# Patient Record
Sex: Female | Born: 1935 | Race: White | Hispanic: No
Health system: Southern US, Community
[De-identification: ages and names within clinical notes are randomized; demographics above are authoritative.]

---

## 2012-02-04 ENCOUNTER — Observation Stay: Payer: Self-pay | Admitting: Internal Medicine

## 2012-02-04 LAB — CBC WITH DIFFERENTIAL/PLATELET
Basophil #: 0.1 10*3/uL (ref 0.0–0.1)
Basophil %: 0.9 %
Eosinophil %: 3.5 %
HCT: 41 % (ref 35.0–47.0)
HGB: 12.9 g/dL (ref 12.0–16.0)
Lymphocyte #: 1.8 10*3/uL (ref 1.0–3.6)
Lymphocyte %: 16.7 %
MCH: 27.4 pg (ref 26.0–34.0)
MCHC: 31.4 g/dL — ABNORMAL LOW (ref 32.0–36.0)
MCV: 87 fL (ref 80–100)
Monocyte #: 1.4 x10 3/mm — ABNORMAL HIGH (ref 0.2–0.9)
Monocyte %: 12.8 %
Neutrophil #: 7.3 10*3/uL — ABNORMAL HIGH (ref 1.4–6.5)

## 2012-02-04 LAB — URINALYSIS, COMPLETE
Bacteria: NONE SEEN
Bilirubin,UR: NEGATIVE
Glucose,UR: NEGATIVE mg/dL (ref 0–75)
Ketone: NEGATIVE
RBC,UR: 1 /HPF (ref 0–5)
Specific Gravity: 1.011 (ref 1.003–1.030)
Squamous Epithelial: NONE SEEN
WBC UR: <1 /HPF (ref 0–5)

## 2012-02-04 LAB — COMPREHENSIVE METABOLIC PANEL
Albumin: 3.1 g/dL — ABNORMAL LOW (ref 3.4–5.0)
Alkaline Phosphatase: 128 U/L (ref 50–136)
Bilirubin,Total: 0.3 mg/dL (ref 0.2–1.0)
Calcium, Total: 9.7 mg/dL (ref 8.5–10.1)
Chloride: 102 mmol/L (ref 98–107)
Co2: 32 mmol/L (ref 21–32)
Creatinine: 0.74 mg/dL (ref 0.60–1.30)
Potassium: 4.4 mmol/L (ref 3.5–5.1)
SGOT(AST): 20 U/L (ref 15–37)
SGPT (ALT): 24 U/L (ref 12–78)
Total Protein: 8.6 g/dL — ABNORMAL HIGH (ref 6.4–8.2)

## 2012-02-04 LAB — TROPONIN I: Troponin-I: <0.02 ng/mL

## 2012-02-06 LAB — CBC WITH DIFFERENTIAL/PLATELET
Basophil %: 0.8 %
HGB: 10.7 g/dL — ABNORMAL LOW (ref 12.0–16.0)
Lymphocyte #: 1.6 10*3/uL (ref 1.0–3.6)
MCH: 28.8 pg (ref 26.0–34.0)
MCHC: 33.2 g/dL (ref 32.0–36.0)
MCV: 87 fL (ref 80–100)
Monocyte #: 1.3 x10 3/mm — ABNORMAL HIGH (ref 0.2–0.9)
Monocyte %: 14.1 %
Neutrophil #: 6.1 10*3/uL (ref 1.4–6.5)
Neutrophil %: 65 %
Platelet: 416 10*3/uL (ref 150–440)
WBC: 9.4 10*3/uL (ref 3.6–11.0)

## 2012-02-06 LAB — BASIC METABOLIC PANEL
Chloride: 107 mmol/L (ref 98–107)
Co2: 30 mmol/L (ref 21–32)
Creatinine: 0.76 mg/dL (ref 0.60–1.30)
EGFR (Non-African Amer.): >60
Glucose: 103 mg/dL — ABNORMAL HIGH (ref 65–99)
Osmolality: 285 (ref 275–301)
Sodium: 144 mmol/L (ref 136–145)

## 2012-02-09 LAB — CULTURE, BLOOD (SINGLE)

## 2012-02-11 ENCOUNTER — Emergency Department: Payer: Self-pay | Admitting: Unknown Physician Specialty

## 2012-02-11 LAB — URINALYSIS, COMPLETE
Hyaline Cast: 6
Nitrite: NEGATIVE
Ph: 5 (ref 4.5–8.0)
RBC,UR: 4 /HPF (ref 0–5)
WBC UR: 6 /HPF (ref 0–5)

## 2012-02-11 LAB — CK TOTAL AND CKMB (NOT AT ARMC)
CK, Total: 95 U/L (ref 21–215)
CK-MB: 2.9 ng/mL (ref 0.5–3.6)

## 2012-02-11 LAB — CBC
HGB: 12.6 g/dL (ref 12.0–16.0)
MCH: 27.8 pg (ref 26.0–34.0)
MCHC: 32.5 g/dL (ref 32.0–36.0)
MCV: 86 fL (ref 80–100)
Platelet: 596 10*3/uL — ABNORMAL HIGH (ref 150–440)
RDW: 15.3 % — ABNORMAL HIGH (ref 11.5–14.5)

## 2012-02-11 LAB — COMPREHENSIVE METABOLIC PANEL
BUN: 16 mg/dL (ref 7–18)
Calcium, Total: 9.2 mg/dL (ref 8.5–10.1)
Chloride: 99 mmol/L (ref 98–107)
Co2: 34 mmol/L — ABNORMAL HIGH (ref 21–32)
EGFR (African American): >60
EGFR (Non-African Amer.): >60
Glucose: 131 mg/dL — ABNORMAL HIGH (ref 65–99)
SGOT(AST): 18 U/L (ref 15–37)
SGPT (ALT): 16 U/L (ref 12–78)

## 2012-02-11 LAB — TROPONIN I: Troponin-I: <0.02 ng/mL

## 2012-02-28 ENCOUNTER — Inpatient Hospital Stay: Payer: Self-pay | Admitting: Internal Medicine

## 2012-02-28 LAB — COMPREHENSIVE METABOLIC PANEL
Albumin: 3.5 g/dL (ref 3.4–5.0)
Bilirubin,Total: 0.5 mg/dL (ref 0.2–1.0)
Chloride: 102 mmol/L (ref 98–107)
Co2: 33 mmol/L — ABNORMAL HIGH (ref 21–32)
EGFR (Non-African Amer.): >60
Glucose: 129 mg/dL — ABNORMAL HIGH (ref 65–99)
SGOT(AST): 19 U/L (ref 15–37)
SGPT (ALT): 18 U/L (ref 12–78)
Sodium: 144 mmol/L (ref 136–145)
Total Protein: 7.3 g/dL (ref 6.4–8.2)

## 2012-02-28 LAB — URINALYSIS, COMPLETE
Bilirubin,UR: NEGATIVE
Glucose,UR: NEGATIVE mg/dL (ref 0–75)
Leukocyte Esterase: NEGATIVE
Nitrite: NEGATIVE
Ph: 5 (ref 4.5–8.0)
Protein: 30
RBC,UR: 2 /HPF (ref 0–5)
Specific Gravity: 1.026 (ref 1.003–1.030)
Squamous Epithelial: NONE SEEN

## 2012-02-28 LAB — CBC
HGB: 12.9 g/dL (ref 12.0–16.0)
MCH: 28.3 pg (ref 26.0–34.0)
MCHC: 32.2 g/dL (ref 32.0–36.0)
MCV: 88 fL (ref 80–100)
Platelet: 272 10*3/uL (ref 150–440)

## 2012-02-28 LAB — PRO B NATRIURETIC PEPTIDE: B-Type Natriuretic Peptide: 251 pg/mL (ref 0–450)

## 2012-02-28 LAB — CK TOTAL AND CKMB (NOT AT ARMC)
CK-MB: 6 ng/mL — ABNORMAL HIGH (ref 0.5–3.6)
CK-MB: 7 ng/mL — ABNORMAL HIGH (ref 0.5–3.6)

## 2012-02-28 LAB — TROPONIN I: Troponin-I: <0.02 ng/mL

## 2012-02-29 LAB — BASIC METABOLIC PANEL
Anion Gap: 7 (ref 7–16)
Co2: 31 mmol/L (ref 21–32)
Creatinine: 0.57 mg/dL — ABNORMAL LOW (ref 0.60–1.30)
EGFR (African American): >60
Glucose: 142 mg/dL — ABNORMAL HIGH (ref 65–99)
Sodium: 146 mmol/L — ABNORMAL HIGH (ref 136–145)

## 2012-02-29 LAB — CBC WITH DIFFERENTIAL/PLATELET
Basophil #: 0 10*3/uL (ref 0.0–0.1)
Basophil %: 0.5 %
Eosinophil #: 0 10*3/uL (ref 0.0–0.7)
HCT: 34.2 % — ABNORMAL LOW (ref 35.0–47.0)
HGB: 11.6 g/dL — ABNORMAL LOW (ref 12.0–16.0)
Lymphocyte %: 11.3 %
MCHC: 33.8 g/dL (ref 32.0–36.0)
Monocyte %: 1.7 %
Neutrophil #: 4.5 10*3/uL (ref 1.4–6.5)
Neutrophil %: 86.4 %
Platelet: 182 10*3/uL (ref 150–440)
RBC: 3.88 10*6/uL (ref 3.80–5.20)
RDW: 16 % — ABNORMAL HIGH (ref 11.5–14.5)
WBC: 5.2 10*3/uL (ref 3.6–11.0)

## 2012-02-29 LAB — URINE CULTURE

## 2012-02-29 LAB — CK TOTAL AND CKMB (NOT AT ARMC): CK-MB: 3.6 ng/mL (ref 0.5–3.6)

## 2012-03-02 LAB — CBC WITH DIFFERENTIAL/PLATELET
Basophil #: 0.1 10*3/uL (ref 0.0–0.1)
Basophil %: 0.5 %
Eosinophil #: 0 10*3/uL (ref 0.0–0.7)
Eosinophil %: 0 %
HCT: 36.3 % (ref 35.0–47.0)
Lymphocyte #: 0.5 10*3/uL — ABNORMAL LOW (ref 1.0–3.6)
MCH: 28 pg (ref 26.0–34.0)
MCHC: 32 g/dL (ref 32.0–36.0)
MCV: 88 fL (ref 80–100)
Monocyte #: 0.4 x10 3/mm (ref 0.2–0.9)
Neutrophil #: 10.9 10*3/uL — ABNORMAL HIGH (ref 1.4–6.5)
Platelet: 195 10*3/uL (ref 150–440)
RDW: 15.9 % — ABNORMAL HIGH (ref 11.5–14.5)
WBC: 11.9 10*3/uL — ABNORMAL HIGH (ref 3.6–11.0)

## 2012-03-02 LAB — BASIC METABOLIC PANEL
Anion Gap: 6 — ABNORMAL LOW (ref 7–16)
Calcium, Total: 8.9 mg/dL (ref 8.5–10.1)
Creatinine: 0.68 mg/dL (ref 0.60–1.30)
EGFR (African American): >60
EGFR (Non-African Amer.): >60
Glucose: 165 mg/dL — ABNORMAL HIGH (ref 65–99)
Sodium: 140 mmol/L (ref 136–145)

## 2012-03-05 LAB — CULTURE, BLOOD (SINGLE)

## 2012-04-12 ENCOUNTER — Ambulatory Visit: Payer: Self-pay | Admitting: Internal Medicine

## 2012-04-12 ENCOUNTER — Inpatient Hospital Stay: Payer: Self-pay | Admitting: Specialist

## 2012-04-12 LAB — CBC WITH DIFFERENTIAL/PLATELET
Eosinophil #: 0.1 10*3/uL (ref 0.0–0.7)
Eosinophil %: 0.4 %
HCT: 40.3 % (ref 35.0–47.0)
Lymphocyte #: 3.9 10*3/uL — ABNORMAL HIGH (ref 1.0–3.6)
Lymphocyte %: 21.2 %
MCH: 29.2 pg (ref 26.0–34.0)
MCV: 91 fL (ref 80–100)
Monocyte #: 1.5 x10 3/mm — ABNORMAL HIGH (ref 0.2–0.9)
Platelet: 375 10*3/uL (ref 150–440)
RBC: 4.45 10*6/uL (ref 3.80–5.20)
WBC: 18.6 10*3/uL — ABNORMAL HIGH (ref 3.6–11.0)

## 2012-04-12 LAB — COMPREHENSIVE METABOLIC PANEL
Albumin: 3.7 g/dL (ref 3.4–5.0)
Alkaline Phosphatase: 82 U/L (ref 50–136)
Bilirubin,Total: 0.2 mg/dL (ref 0.2–1.0)
Chloride: 108 mmol/L — ABNORMAL HIGH (ref 98–107)
Co2: 28 mmol/L (ref 21–32)
Creatinine: 0.81 mg/dL (ref 0.60–1.30)
EGFR (African American): >60
Osmolality: 295 (ref 275–301)
Potassium: 3.8 mmol/L (ref 3.5–5.1)
Sodium: 147 mmol/L — ABNORMAL HIGH (ref 136–145)
Total Protein: 7.2 g/dL (ref 6.4–8.2)

## 2012-04-12 LAB — URINALYSIS, COMPLETE
Bilirubin,UR: NEGATIVE
Blood: NEGATIVE
Hyaline Cast: 3
Ketone: NEGATIVE
Specific Gravity: 1.019 (ref 1.003–1.030)
Squamous Epithelial: 4
WBC UR: 18 /HPF (ref 0–5)

## 2012-04-12 LAB — PROTIME-INR: Prothrombin Time: 12.7 secs (ref 11.5–14.7)

## 2012-04-12 LAB — TROPONIN I: Troponin-I: 0.1 ng/mL — ABNORMAL HIGH

## 2012-04-12 LAB — CK TOTAL AND CKMB (NOT AT ARMC): CK, Total: 109 U/L (ref 21–215)

## 2012-04-13 LAB — BASIC METABOLIC PANEL
Anion Gap: 7 (ref 7–16)
Calcium, Total: 9.2 mg/dL (ref 8.5–10.1)
Chloride: 108 mmol/L — ABNORMAL HIGH (ref 98–107)
Co2: 31 mmol/L (ref 21–32)
EGFR (African American): >60
Osmolality: 294 (ref 275–301)
Potassium: 4.9 mmol/L (ref 3.5–5.1)

## 2012-04-13 LAB — CBC WITH DIFFERENTIAL/PLATELET
Basophil #: 0 10*3/uL (ref 0.0–0.1)
Eosinophil #: 0 10*3/uL (ref 0.0–0.7)
Eosinophil %: 0 %
Lymphocyte #: 1 10*3/uL (ref 1.0–3.6)
MCH: 29.5 pg (ref 26.0–34.0)
MCHC: 32.6 g/dL (ref 32.0–36.0)
MCV: 91 fL (ref 80–100)
Monocyte #: 0.5 x10 3/mm (ref 0.2–0.9)
Neutrophil #: 13.3 10*3/uL — ABNORMAL HIGH (ref 1.4–6.5)
Platelet: 287 10*3/uL (ref 150–440)
RBC: 3.91 10*6/uL (ref 3.80–5.20)
RDW: 16.4 % — ABNORMAL HIGH (ref 11.5–14.5)

## 2012-04-13 LAB — LIPID PANEL
HDL Cholesterol: 79 mg/dL — ABNORMAL HIGH (ref 40–60)
Triglycerides: 77 mg/dL (ref 0–200)
VLDL Cholesterol, Calc: 15 mg/dL (ref 5–40)

## 2012-04-13 LAB — URINE CULTURE

## 2012-04-15 ENCOUNTER — Ambulatory Visit: Payer: Self-pay | Admitting: Internal Medicine

## 2012-04-15 LAB — CULTURE, BLOOD (SINGLE)

## 2012-04-17 LAB — CULTURE, BLOOD (SINGLE)

## 2012-10-13 DEATH — deceased

## 2013-09-28 IMAGING — CT CT HEAD WITHOUT CONTRAST
2 series · 15 of 30 positions shown, 19 images · non-contrast
Comparison: none

REASON FOR EXAM: ams
COMMENTS:   May transport without cardiac monitor

PROCEDURE:     CT  - CT HEAD WITHOUT CONTRAST  - February 04, 2012  [DATE]
RESULT:     Comparison:  None
TECHNIQUE: Multiple axial images from the foramen magnum to the vertex were
obtained without IV contrast.

[Series 2: without · axial · non-contrast · 0.43mm/px · z∈[-152,-32]mm · 13 of 29 slices shown, 17 images]
[im 3/29  brain]
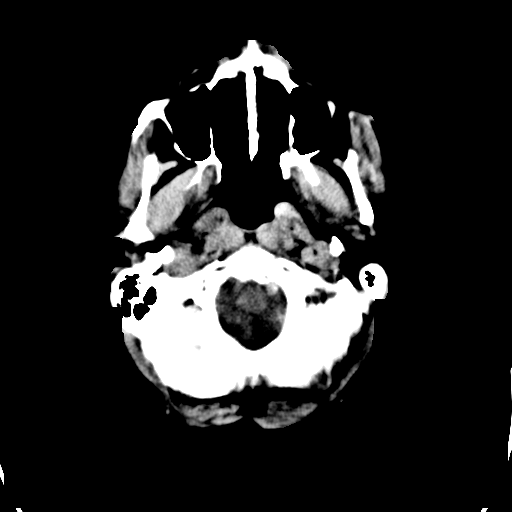
[im 3/29  bone]
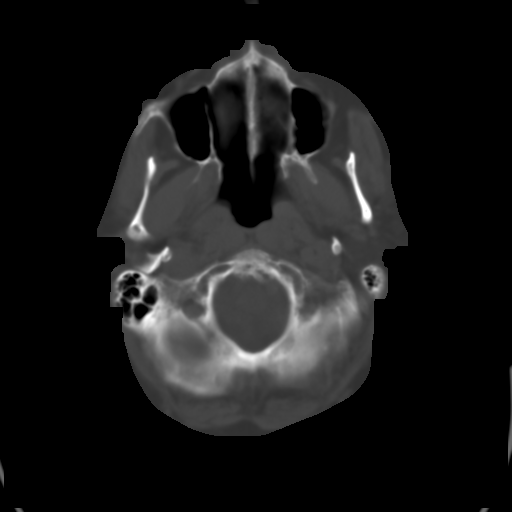
[im 5/29  brain]
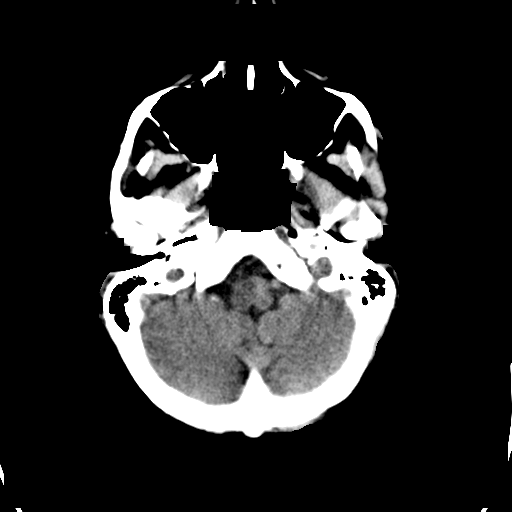
[im 7/29  brain]
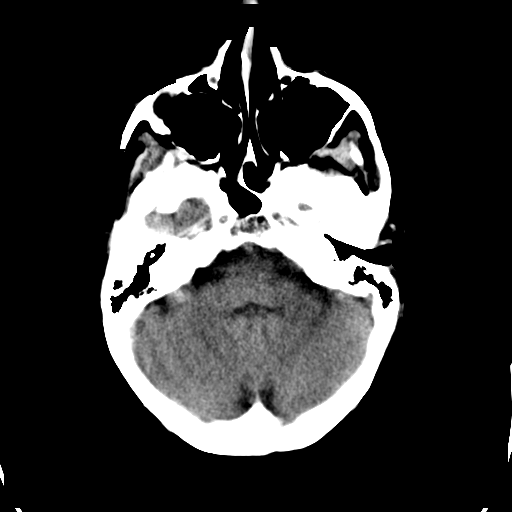
[im 9/29  brain]
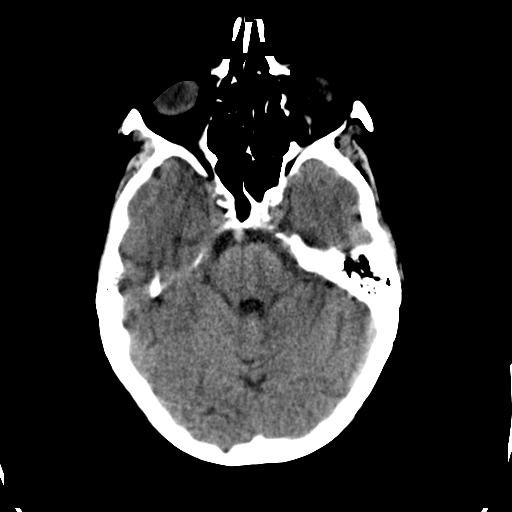
[im 11/29  brain]
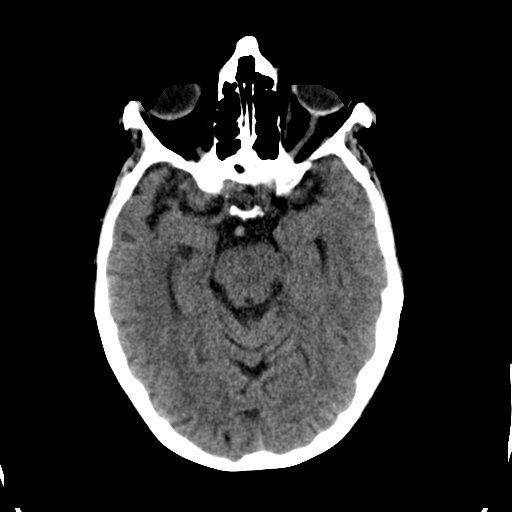
[im 11/29  bone]
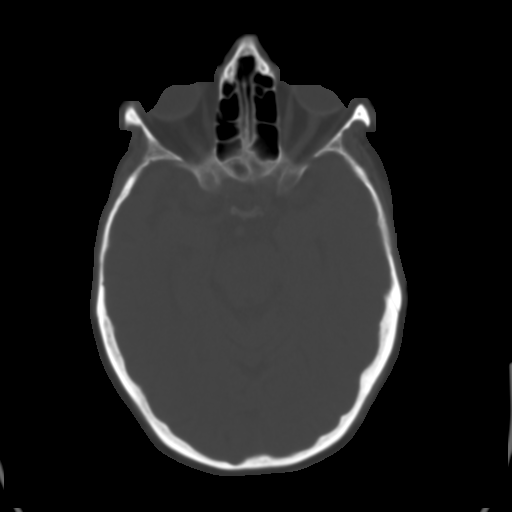
[im 13/29  brain]
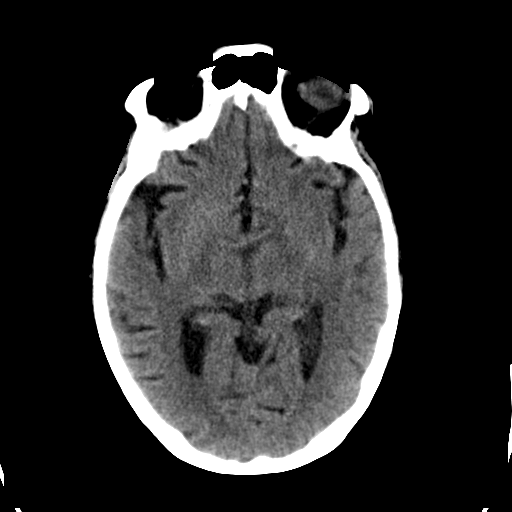
[im 15/29  brain]
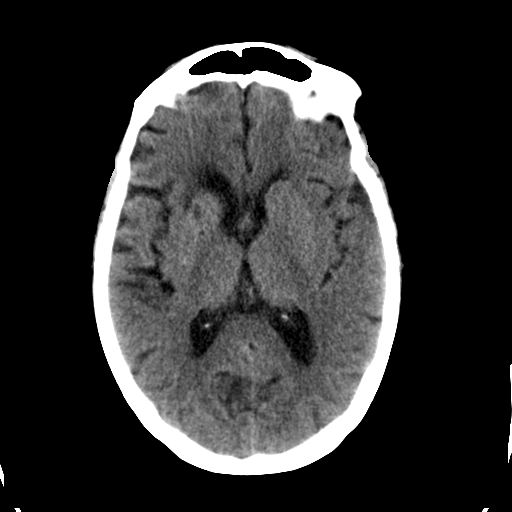
[im 17/29  brain]
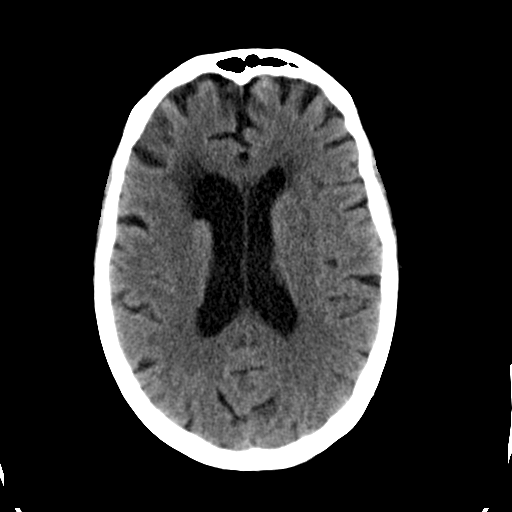
[im 19/29  brain]
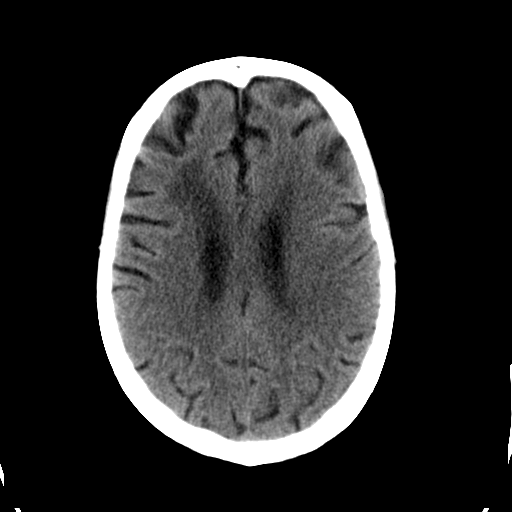
[im 19/29  bone]
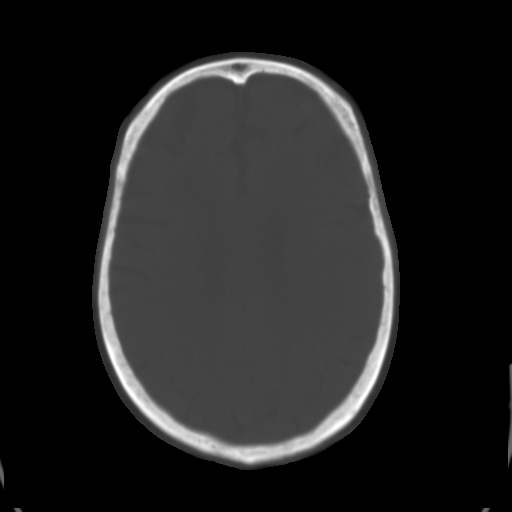
[im 21/29  brain]
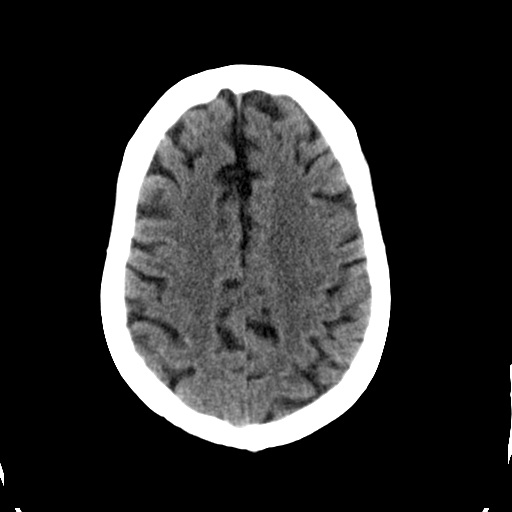
[im 23/29  brain]
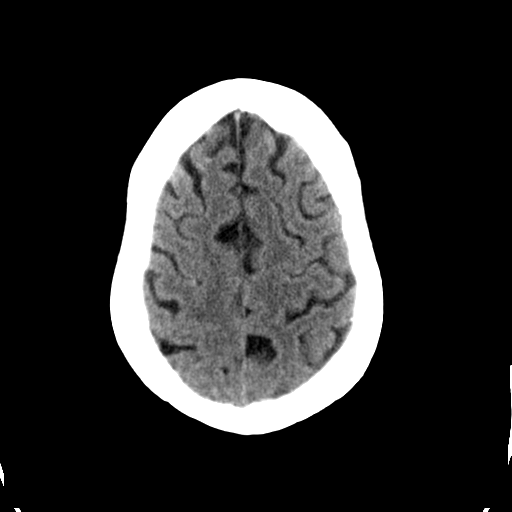
[im 25/29  brain]
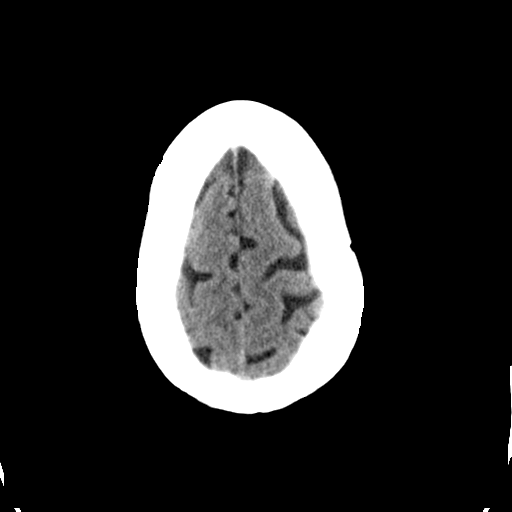
[im 27/29  brain]
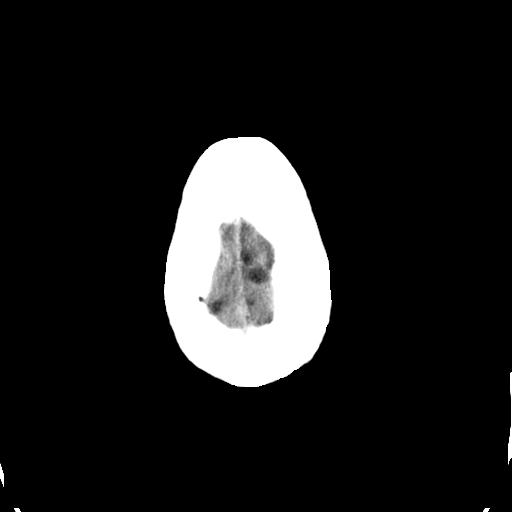
[im 27/29  bone]
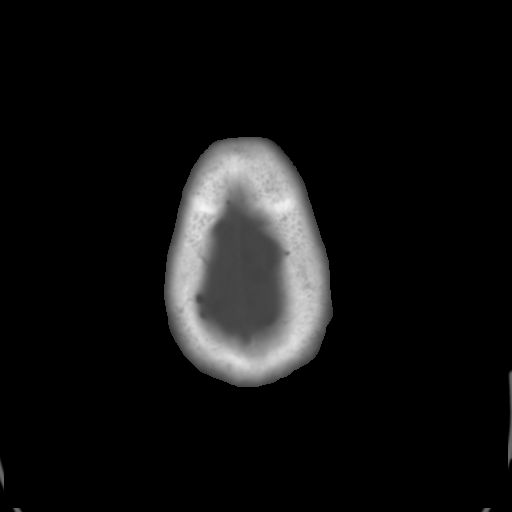

[Series 3: bone · axial · 0.43mm/px · z∈[-152,-132]mm · 2 of 29 slices shown]
[im 3/29  bone]
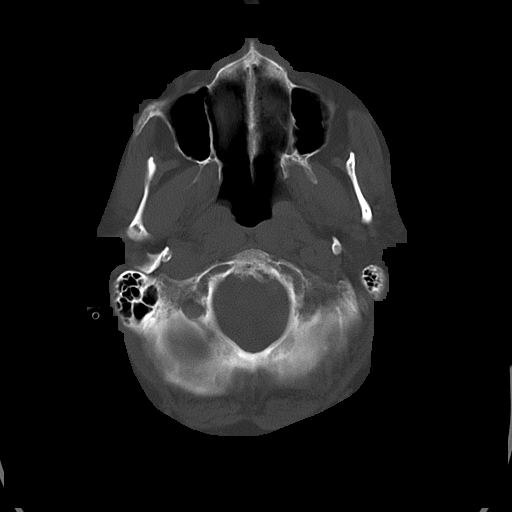
[im 7/29  bone]
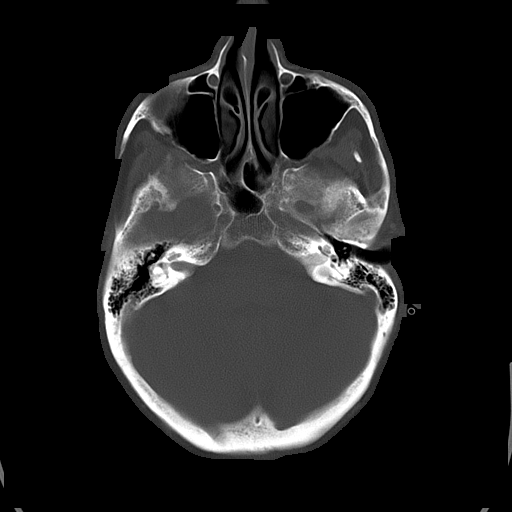

[15 of 30 positions shown; findings below may reference images not displayed]

FINDINGS: There is no evidence of mass effect, midline shift, or extra-axial fluid
collections.  There is no evidence of a space-occupying lesion or
intracranial hemorrhage. There is no evidence of a cortical-based area of
acute infarction. There is an old right basal ganglia lacunar infarct. There
is generalized cerebral atrophy. There is periventricular white matter low
attenuation likely secondary to microangiopathy.

The ventricles and sulci are appropriate for the patient's age. The basal
cisterns are patent.

Visualized portions of the orbits are unremarkable. The visualized portions
of the paranasal sinuses and mastoid air cells are unremarkable.

The osseous structures are unremarkable.
IMPRESSION: No acute intracranial process.

CT can underestimate ischemia in the first 24 hours after the event. If
there is clinical concern for an acute infarct, a followup MRI or repeat CT
scan in 24 hours may provide additional information.

[REDACTED]

## 2013-10-05 IMAGING — CT CT CHEST W/ CM
1 series · 15 of 31 positions shown, 19 images · IV contrast (agent unspecified)
Comparison: none

REASON FOR EXAM: sob weakness pe protocol
COMMENTS:   May transport without cardiac monitor

PROCEDURE:     CT  - CT CHEST WITH CONTRAST  - February 11, 2012  [DATE]
RESULT:
TECHNIQUE: Helical 3 mm sections were obtained from the thoracic inlet
through the lung bases status post intravenous administration of 100 mm of
[id number].

[Series 4: soft tissue · axial · 0.62mm/px · z∈[-750,-492]mm · 15 of 94 slices shown, 19 images]
[im 4/94  mediastinal]
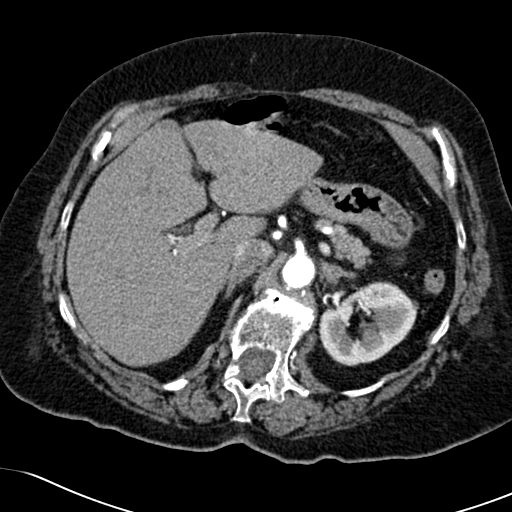
[im 4/94  lung]
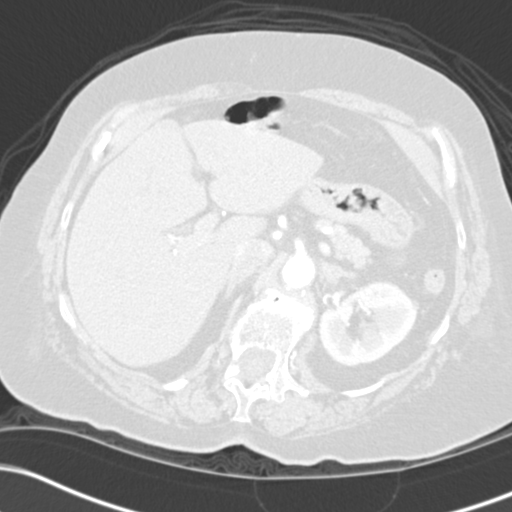
[im 11/94  lung]
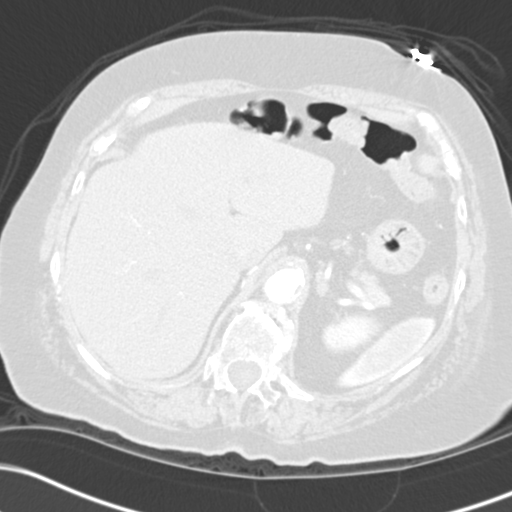
[im 18/94  lung]
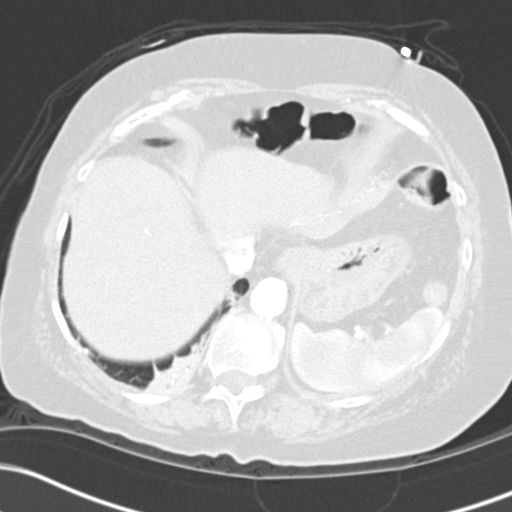
[im 21/94  lung]
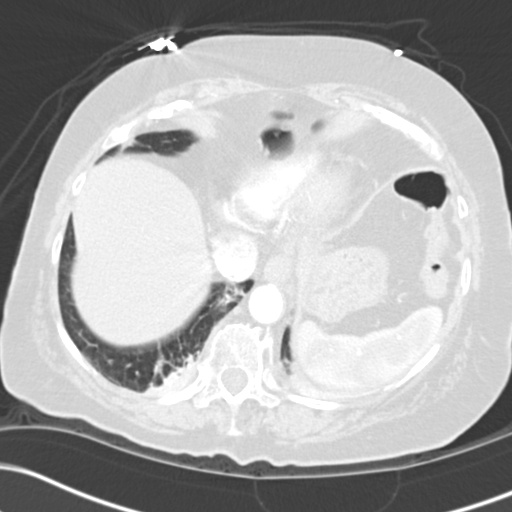
[im 28/94  mediastinal]
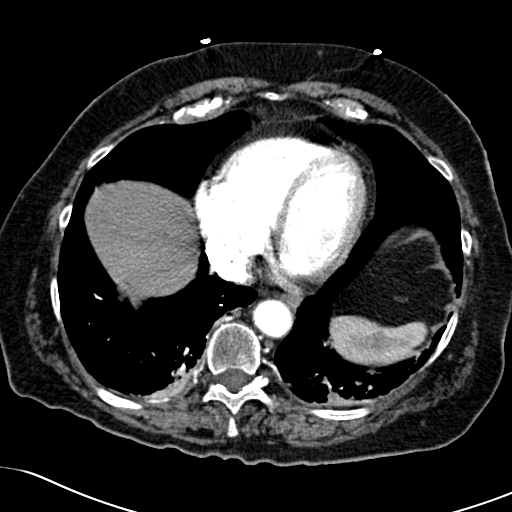
[im 28/94  lung]
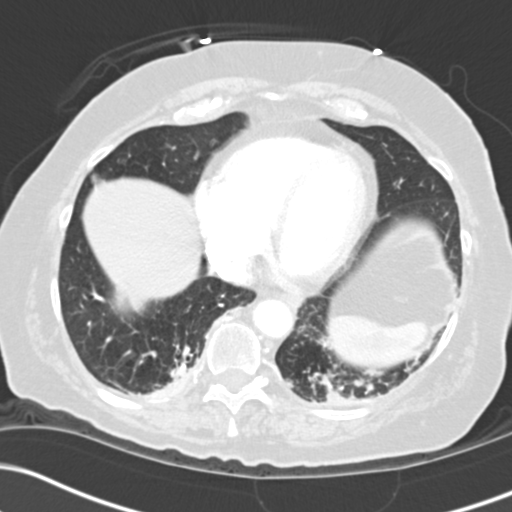
[im 35/94  lung]
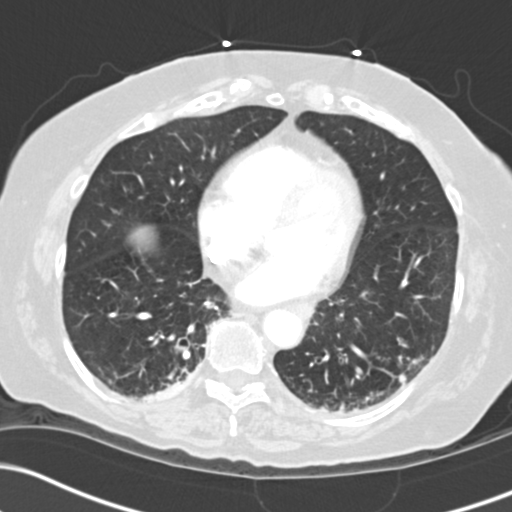
[im 42/94  lung]
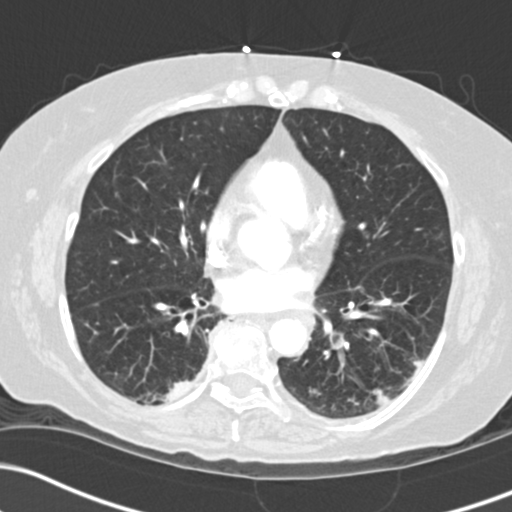
[im 49/94  lung]
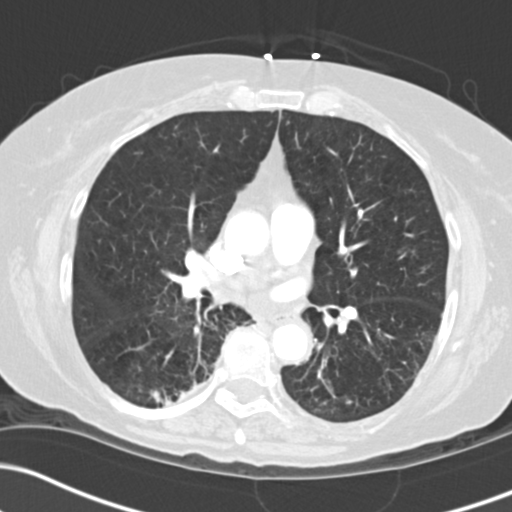
[im 52/94  mediastinal]
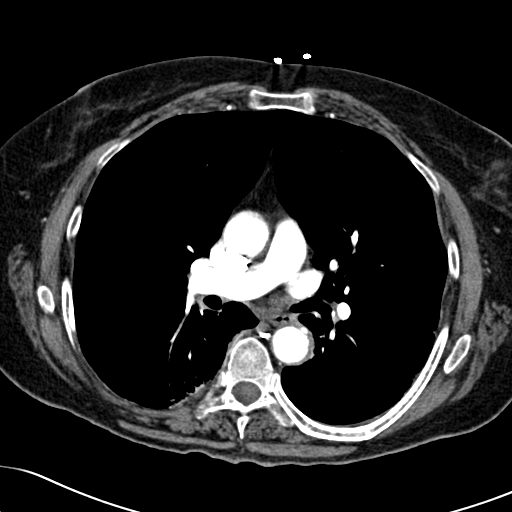
[im 52/94  lung]
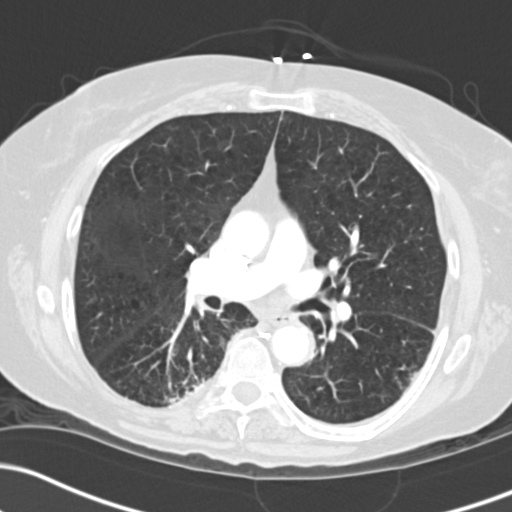
[im 59/94  lung]
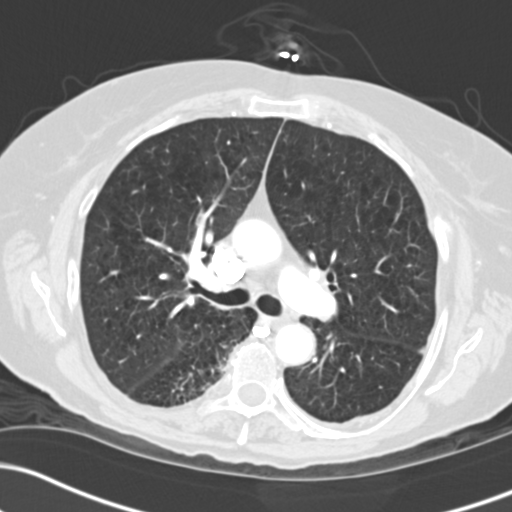
[im 66/94  lung]
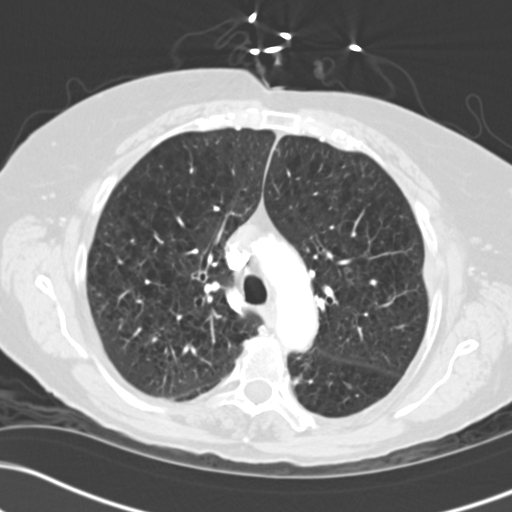
[im 73/94  lung]
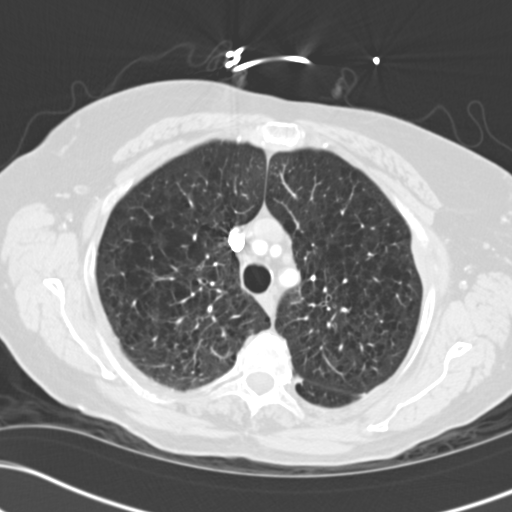
[im 76/94  mediastinal]
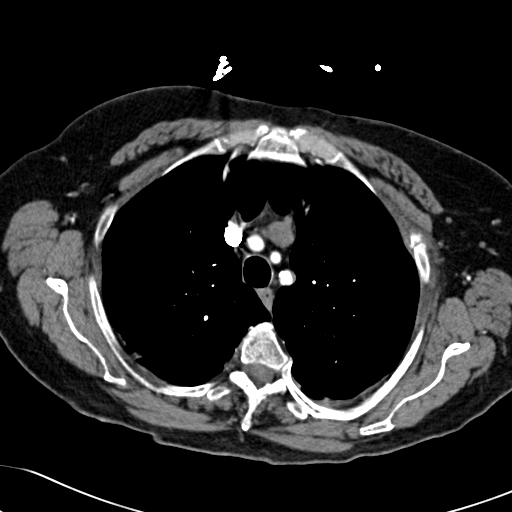
[im 76/94  lung]
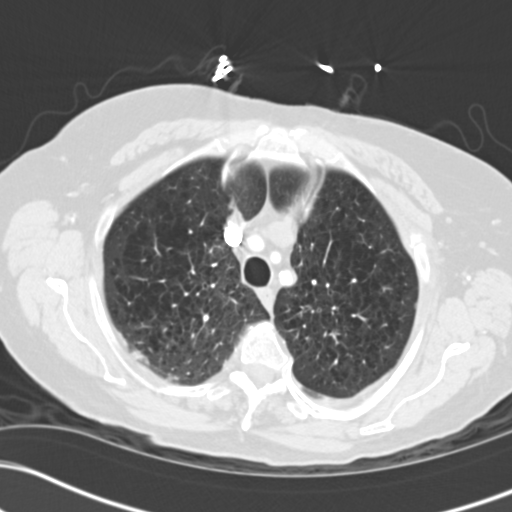
[im 83/94  lung]
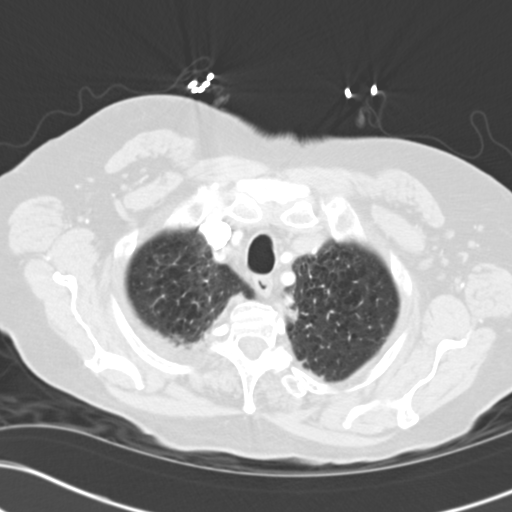
[im 90/94  lung]
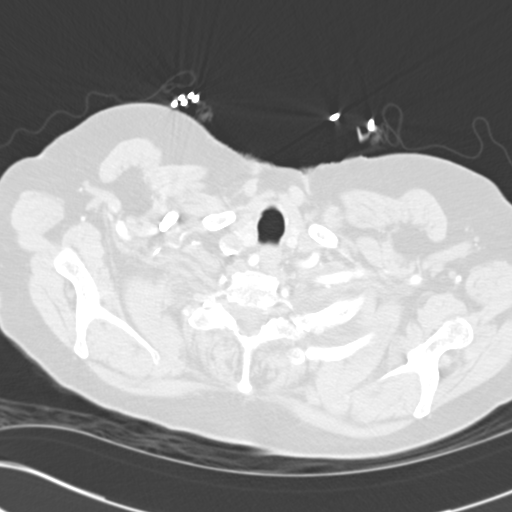

[15 of 31 positions shown; findings below may reference images not displayed]

FINDINGS: The mediastinum, hilar regions and structures demonstrate no
evidence of mediastinal or hilar adenopathy or masses.

There is no evidence of filling defects within the main, lobar or segmental
pulmonary arteries. Diffuse emphysematous changes are appreciated throughout
both lungs with interstitial changes the within the lung bases. Patchy
ill-defined multifocal areas of increased density project within the
dependent portions of the lungs. A more consolidative component is
identified on the right.

The visualized upper abdominal viscera demonstrate no gross abnormalities.
IMPRESSION: 1. No CT evidence of pulmonary arterial embolic disease.
2. Emphysematous changes moderate to severe bilaterally
3. Atelectasis versus infiltrate within lung bases as well as likely a
component of pulmonary fibrosis.
4. Not mentioned above, a healing rib fracture is identified.
5. Dr. Tiger of the Emergency Department was informed of these findings via
preliminary faxed report.

## 2013-10-22 IMAGING — CR DG CHEST 1V PORT
1 series · 1 of 1 positions shown · non-contrast
Comparison: none

REASON FOR EXAM: Shortness of Breath
COMMENTS:

PROCEDURE:     DXR - DXR PORTABLE CHEST SINGLE VIEW  - February 28, 2012 [DATE]
RESULT:     Comparison: 02/11/2012

[ap]
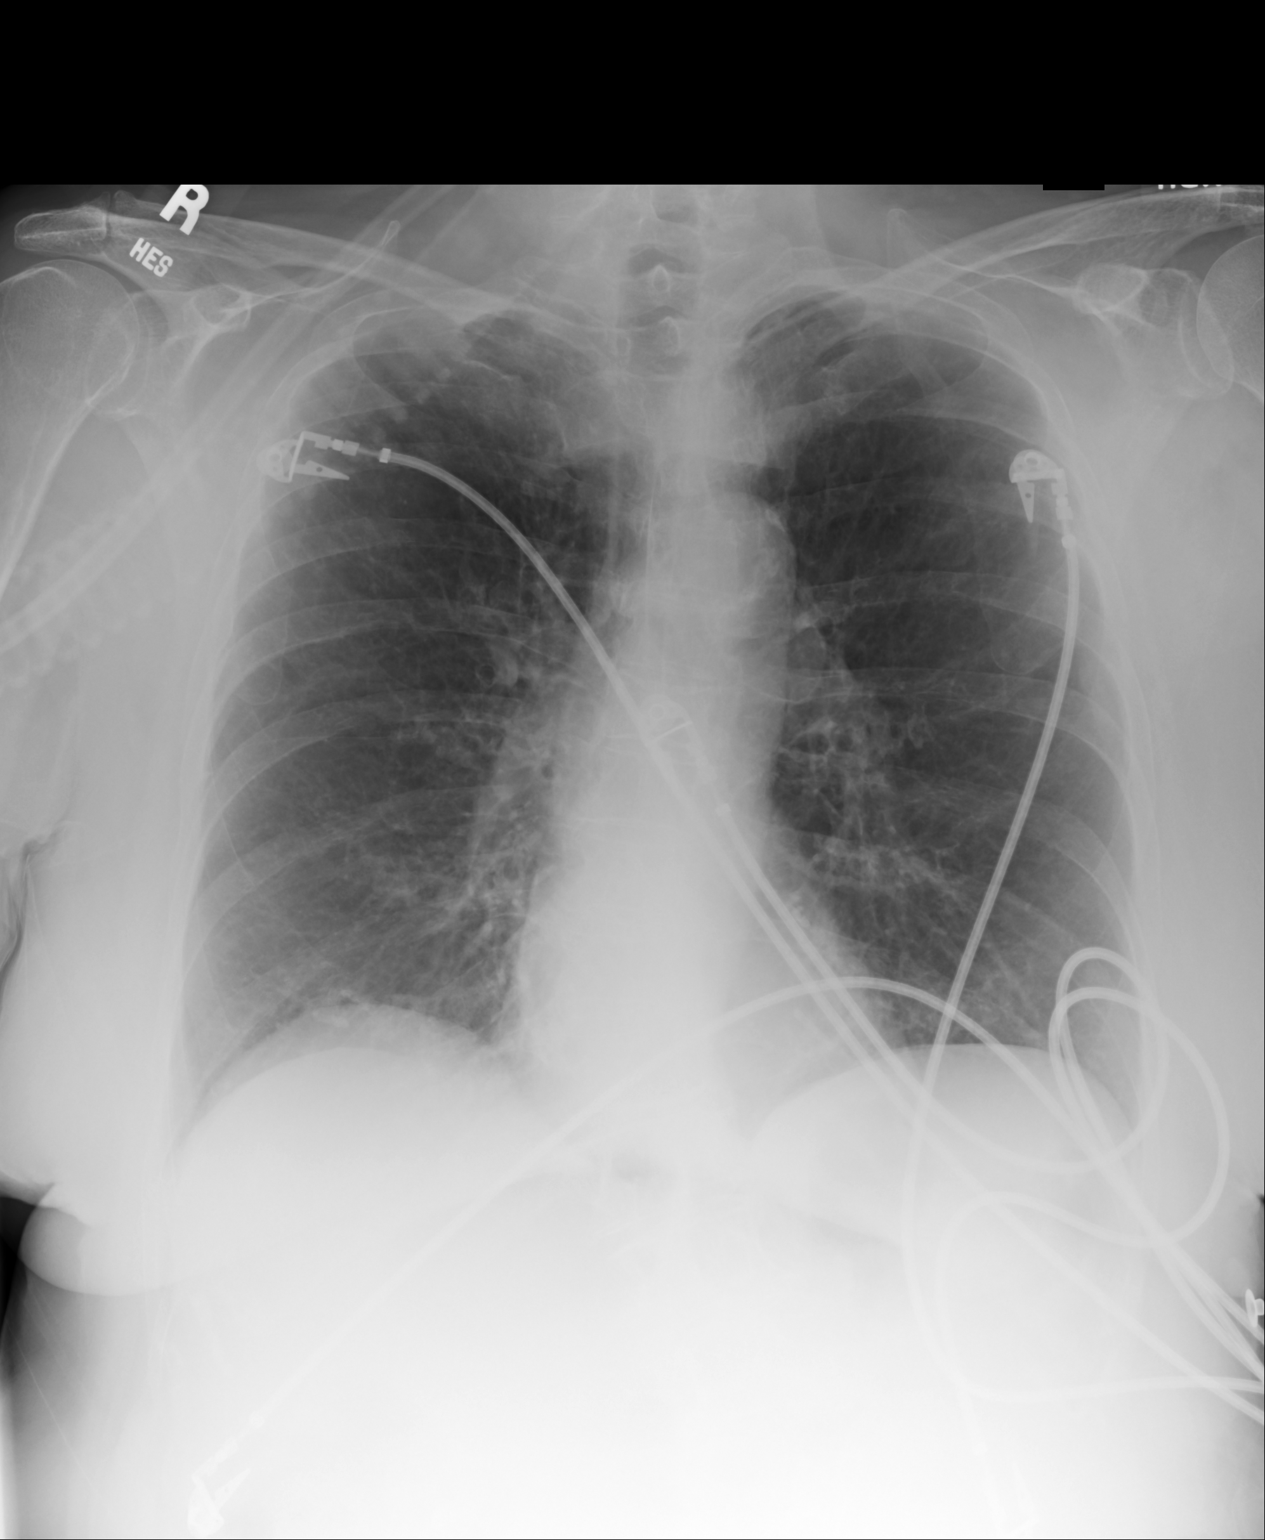

[1 of 1 positions shown; findings below may reference images not displayed]

FINDINGS: The heart and mediastinum are stable. Mild prominence of the superior
mediastinum is similar to prior. The lungs are hyperinflated. Mild
interstitial opacities are similar to prior, likely secondary to chronic
interstitial lung disease.
IMPRESSION: Hyperinflation. Otherwise, no acute cardiopulmonary disease.

[REDACTED]

## 2014-10-02 NOTE — H&P (Signed)
PATIENT NAME:  Danielle Moran, Danielle Moran MR#:  [id number] DATE OF BIRTH:  September 21, 1930  DATE OF ADMISSION:  02/28/2012  PRIMARY CARE PHYSICIAN: Dr. Sharman Crate Hamrick   History obtained from patient, her daughter and son-in-law at bedside. Old records have been reviewed, EKG and chest x-ray personally reviewed.   CHIEF COMPLAINT: Shortness of breath.   HISTORY OF PRESENT ILLNESS: 81 year old Caucasian female patient with history of chronic obstructive pulmonary disease, chronic respiratory failure on 3 liters oxygen at home presents to the Emergency Room brought in by EMS after patient had progressively worsening shortness of breath over the last three days. Patient was previously in the hospital from 08/22 to 02/07/2012 and discharged, returned to Emergency Room with similar problems on 08/29, was started on prednisone and sent home from ER. Patient felt mildly better but started worsening and patient returned to the Emergency Room today. She has had a dry cough. No chest pain. No nausea, vomiting, abdominal pain, diarrhea, dysuria. She does feel weak with progressively shortness of breath. Patient smoked in the past, does not smoke anymore. Lives at [facility] with her daughter and son-in-law. She finished her prednisone four days back.   PAST MEDICAL HISTORY:  1. Chronic obstructive pulmonary disease.  2. Chronic respiratory failure on 2 to 3 liters oxygen at home.  3. Hypertension.  4. Hyperlipidemia.  5. Depression.  6. Gastroesophageal reflux disease.   REVIEW OF SYSTEMS: CONSTITUTIONAL: Complains of fatigue, weakness. No weight loss or weight gain. EYES: No blurred vision, pain, redness. ENT: No tinnitus, ear pain, hearing loss. RESPIRATORY: Has cough which is dry. No hemoptysis. Shortness of breath positive. CARDIOVASCULAR: No chest pain, orthopnea, edema. GASTROINTESTINAL: No nausea, vomiting, diarrhea, abdominal pain. GENITOURINARY: No dysuria, hematuria, frequency. GENITOURINARY: No sores, discharge.  ENDOCRINE: No polyuria, nocturia, thyroid problems. HEMATOLOGIC/LYMPHATIC: No anemia, easy bruising, bleeding. INTEGUMENTARY: No acne, skin rash. MUSCULOSKELETAL: Has arthritic pain. No swelling or gout. NEUROLOGIC: No numbness, weakness, dysarthria. PSYCHIATRIC: No anxiety but has depression.   ALLERGIES: No known drug allergies.   SOCIAL HISTORY: Patient was a long-time smoker with 40 pack smoking history but quit many years back. Occasional alcohol use. No illicit drug use. Lives with her daughter and son-in-law.   FAMILY HISTORY: No significant family history of coronary artery disease or diabetes.   HOME MEDICATIONS:  1. Advair 250/50 1 puff b.i.d.  2. Celexa 20 mg daily.  3. Lasix 20 mg daily.  4. Calcium carbonate 1 tablet daily.  5. Gabapentin 400 mg 3 times a day.  6. Metoprolol succinate 12.5 mg daily.  7. Omeprazole 20 mg twice a day.  8. Percocet 5/325, 1 tablet every six hours as needed.  9. Pravachol 20 mg at bedtime.  10. Albuterol inhaler 2 puffs q.6 hours as needed.  11. Spiriva 1 puff daily.  12. Remeron 15 mg at bedtime.  13. Tylenol arthritis 650 mg as needed every six hours.   PHYSICAL EXAMINATION:  VITAL SIGNS: Temperature 99, pulse 136, respirations 40, blood pressure 130/69 and saturating 96% on BiPAP.   GENERAL: Elderly frail Caucasian female patient lying in bed in significant respiratory distress using accessory muscles with conversational dyspnea.   PSYCHIATRIC: Alert and oriented x3. Mood and affect appropriate. Judgment intact but anxious.   HEENT: Atraumatic, normocephalic. Oral mucosa dry and pink. External ears and nose normal. No pallor. No icterus. Pupils bilaterally equal, reactive to light.   NECK: Supple. No thyromegaly. No palpable lymph nodes. Trachea midline. No carotid bruit, JVD.   CARDIOVASCULAR: S1, S2, tachycardic  without any murmurs. Peripheral pulses 2+. No edema.   RESPIRATORY: Decreased air entry bilaterally with prolonged  expiration and expiratory wheezes.   GASTROINTESTINAL: Soft abdomen, nontender. Bowel sounds present. No hepatosplenomegaly palpable.   SKIN: Warm and dry. No petechiae, rash, ulcers.   MUSCULOSKELETAL: No joint swelling, redness, effusion of the large joints. Normal muscle tone.   LYMPHATIC: No cervical lymphadenopathy.   NEUROLOGICAL: Motor strength 5/5 in upper and lower extremities. Sensation to fine touch intact all over.   LABORATORY, DIAGNOSTIC AND RADIOLOGICAL DATA: Laboratory studies show glucose 129, BUN 12, creatinine 0.68, sodium 144, potassium 3.1, bicarbonate 33. AST, ALT, alkaline phosphatase, bilirubin normal. Troponin less than 0.02. WBC 17.7, hemoglobin 12.9, platelets 272.   ABG shows pH of 7.36 with pCO2 of 57, pO2 86 on  BiPAP.   Chest x-ray shows hyperinflation with no acute infiltrates.   EKG shows sinus tachycardia at 140. Seems to have incomplete right bundle branch block, significant artifact. No acute ST elevation.   ASSESSMENT AND PLAN:  1. Acute on chronic respiratory failure secondary to acute chronic obstructive pulmonary disease exacerbation. Patient is needing BiPAP support and still has significant wheezing with no significant improvement in spite of IV steroids, magnesium IV and multiple breathing treatments. Will admit the patient with BiPAP for close monitoring. Continue the IV steroids and scheduled nebulizer treatment. Chest x-ray shows no infiltrate. Will start patient on Levaquin for coverage at this time. I have discussed with the patient regarding her CODE STATUS as patient is at high risk for further deterioration of her breathing. Patient is DNR/DNI as per the patient agreed on by her daughter and son-in-law at bedside. Patient will be tried to be weaned off to nasal cannula as tolerated.  2. Sinus tachycardia secondary to chronic obstructive pulmonary disease exacerbation. Patient does seem to be hydrated, is on Lasix, it should be held. Started on  hydration at this time.  3. Hypertension, well controlled.  4. Hypokalemia likely secondary from being on Lasix. Will replace orally and recheck in the morning.  5. Depression. Continue medications.  6. Deep vein thrombosis prophylaxis with heparin.  7. CODE STATUS: DNR/DNI as per patient's wishes.   TIME SPENT TODAY ON THIS CASE: 75 minutes with more than 50% time spent in coordination of care.   ____________________________ Molinda BailiffSrikar R. Oluwaferanmi Wain, MD srs:cms D: 02/28/2012 15:28:20 ET T: 02/28/2012 15:55:41 ET JOB#: [id number]  cc: Wardell HeathSrikar R. Ladan Vanderzanden, MD, <Dictator> Maura L. Hamrick, MD Orie FishermanSRIKAR R Tahje Borawski MD ELECTRONICALLY SIGNED 02/28/2012 17:58

## 2014-10-02 NOTE — Discharge Summary (Signed)
PATIENT NAME:  Danielle Moran, Danielle Moran MR#:  [id number] DATE OF BIRTH:  [birth year]  DATE OF ADMISSION:  02/28/2012 DATE OF DISCHARGE:  03/03/2012  ADMITTING DIAGNOSIS: Shortness of breath.   DISCHARGE DIAGNOSES:  1. Acute on chronic respiratory failure, secondary to acute chronic obstructive pulmonary disease exacerbation.  2. Likely end-stage chronic obstructive pulmonary disease.  3. Sinus tachycardia felt to be due to her acute chronic obstructive pulmonary disease exacerbation.  4. Hypokalemia due to diuretic therapy.  5. Hypertension.  6. Depression.  7. Chronic respiratory failure, on 3L of O2 at all times.   CONSULTANTS: None.   PERTINENT LABS AND EVALUATIONS: Admitting glucose 129, BUN 12, creatinine 0.68, sodium was 144, potassium was 3.1, bicarbonate 33. AST and ALT and LFTs were normal. Troponin was less than 0.02. WBC count was 17.7, hemoglobin 12.9, platelet count was 272,000. ABG showed a pH of 7.36, pCO2 of 57, pO2 was 86 on BiPAP. Chest x-ray showed hyperinflation with no acute infiltrate. EKG showed sinus tachycardia with heart rate of 140, incomplete right bundle branch block. The patient's potassium was 3.5 on March 02, 2012.  Her troponin remained less than 0.02 x 3. Her WBC count on March 02, 2012, was 11.9. Blood cultures no growth. Urine cultures no growth. Chest x-ray PA and lateral showed interstitial prominence, may be related to chronic interstitial lung disease or fibrosis. No acute cardiopulmonary process noted.    HOSPITAL COURSE: Please refer to History and Physical done by the admitting physician. The patient is a 81 year old Caucasian female with chronic respiratory failure. Was on 3L of O2, has chronic obstructive pulmonary disease who was previously hospitalized from 02/04/2012 to 02/07/2012 and was discharged. She returned to the ED with a similar presentation on 08/29/20123, was started on p.o. prednisone and discharged home. She returned back with the same  symptoms. Patient again presented with shortness of breath and was noted to be in acute respiratory failure, had to be placed on BiPAP.  She was treated for acute on chronic obstructive pulmonary disease exacerbation with nebulizers, steroids, and IV antibiotics. The patient was treated aggressively. Initially required BiPAP and then subsequently transitioned to O2. Based on her presentation and frequent admissions, she likely has end-stage chronic obstructive pulmonary disease. The patient understands that she was made DO NOT RESUSCITATE by the admitting physician. The patient's breathing improved as the hospitalization progressed. Now she is back to 3L of O2. Also, arrangements have been made for home health to follow the patient. At this time she is stable for discharge.   DISCHARGE MEDICATIONS:  1. Omeprazole 20 on tab p.o. b.i.d.  2. Citalopram 40 daily.  3. Tylenol Arthritis 1 tab p.o. daily as needed for pain. 4. Calcium 600 one tablet p.o. daily.  5. Metoprolol succinate 12.5 daily.  6. ProAir 90 mcg 2 puffs 4 times per day as needed.  7. Venlafaxine 75 mg 1 tab p.o. b.i.d.  8. Gabapentin 400 one tablet p.o. t.i.d.  9. Pravastatin 20 mg at bedtime.  10. Advair 250/50 one puff b.i.d. 11. Lasix 20 daily.  12. Acetaminophen/oxycodone 325/5 one tablet q.6 hours p.r.n. pain.  13. Spiriva 18 mcg daily.  14. Mirtazapine 15 mg at bedtime.  15. Levaquin 250 p.o. daily x5 days. 16. Prednisone taper start at 60 mg, taper by 10 mg until complete.  17. Albuterol/Atrovent nebulizers q.4 p.r.n.  18. O2 at 3L.   DIET: Low sodium.   ACTIVITY: As tolerated previously.   REFERRAL: Home health.  FOLLOWUP: Follow with Dr. Burnell Blanks in 1 to 2 weeks.   TIME SPENT: 33 minutes.   ____________________________ Lacie Scotts Allena Katz, MD shp:vtd D: 03/03/2012 21:32:40 ET T: 03/07/2012 11:34:51 ET JOB#: [id number]  cc: Burel Kahre H. Allena Katz, MD, <Dictator> Charise Carwin  MD ELECTRONICALLY SIGNED 03/08/2012 14:59

## 2014-10-02 NOTE — H&P (Signed)
PATIENT NAME:  Danielle Moran, Danielle Moran MR#:  [id number] DATE OF BIRTH:  10/04/1930  DATE OF ADMISSION:  04/12/2012  PRIMARY CARE PHYSICIAN:  Dr. Sharman Crate Hamrick.   CHIEF COMPLAINT: Shortness of breath.   HISTORY OF PRESENT ILLNESS: This is a 81 year old female who was recently in the hospital and treated for acute on chronic respiratory failure and chronic obstructive pulmonary disease exacerbation. She was discharged on 03/03/2012. She was doing well up until today when she developed shortness of breath where she could not breathe. She has a dry cough. She stated it started today. No complaints of wheezing. No complaints of chest pain. In the Emergency Room, she was found to be in respiratory distress and tachycardic and put on BiPAP. The patient feels better once put on BiPAP. The patient's pulse oximetry was 100% when she came in on the oxygen, unclear how much oxygen at that point, but her respirations was up to 30, pulse 144. Hospitalist services were contacted for further evaluation. Of note, the patient also has some right arm pain for when she had a flu shot upon discharge from the hospital last hospitalization.   PAST MEDICAL HISTORY:  1. Chronic obstructive pulmonary disease, end-stage, chronically on 3 liters of oxygen.  2. Hypertension. 3. Hyperlipidemia. 4. Depression. 5. Gastroesophageal reflux disease. 6. Arthritis.   PAST SURGICAL HISTORY:  1. Two back surgeries. 2. Abdominal exploration surgery.   ALLERGIES: No known drug allergies.   1. MEDICATIONS: Of note, prescription writer was not updated from prior discharge, includes: Omeprazole 20 mg twice a day. 2. Celexa 40 mg daily.  3. Tylenol Arthritis p.r.n.  4. Calcium 600 mg daily.  5. Metoprolol ER 12.5 mg daily.  6. ProAir 2 puffs 4 times a day.  7. Venlafaxine 75 mg twice a day.  8. Gabapentin 400 mg t.i.d.  9. Pravastatin 20 mg at bedtime.  10. Advair Diskus 250/50, one inhalation twice a day.  11. Lasix 20 mg daily.   12. Percocet 5/325, one tablet every six hours as needed for pain.  13. Spiriva one inhalation daily.  14. Remeron 15 mg at bedtime.  15. Nebulizers, DuoNeb every four hours p.r.n.  16. Oxygen 3 liters.   SOCIAL HISTORY: Quit smoking many years ago. No alcohol. No drug use. Lives with her daughter and son-in-law.   FAMILY HISTORY: Father died of heart problems. Mother died of old age.   REVIEW OF SYSTEMS: CONSTITUTIONAL: No fever. No chills. Positive for sweats. Positive for fatigue. No weight loss. No weight gain. EYES: She does wear glasses. EARS, NOSE, MOUTH, AND THROAT: Decreased hearing. Positive for runny nose. No sore throat. No difficulty swallowing. CARDIOVASCULAR: No chest pain. No palpitations. RESPIRATORY: Positive for shortness of breath with dry cough. GASTROINTESTINAL: Positive for nausea. No vomiting. No abdominal pain. Positive for diarrhea. No bright red blood per rectum. No melena. GENITOURINARY: No burning on urination. No hematuria. MUSCULOSKELETAL: Arthritis pain and right arm pain. INTEGUMENT: No rashes or eruptions. NEUROLOGIC: Fainting two admissions ago. PSYCHIATRIC: On medication for depression. ENDOCRINE: No thyroid problems. HEMATOLOGIC/LYMPHATIC: No anemia, no easy bruising or bleeding   PHYSICAL EXAMINATION:  LAST VITAL SIGNS: Temperature 98.3, pulse 119, respirations 16, blood pressure 98/48, pulse oximetry 99% on BiPAP 40%.   VITAL SIGNS WHEN SHE PRESENTED TO THE ER: Temperature 98.3, pulse 144, respirations 30, blood pressure 128/62, pulse oximetry 100% on oxygen, unclear source of oxygen amount.   GENERAL: Positive respiratory distress on BiPAP.   EYES: Conjunctivae and lids normal. Pupils equal, round, and  reactive to light. Extraocular muscles intact. No nystagmus.   EARS, NOSE, MOUTH, AND THROAT: Tympanic membranes, no erythema. Nasal mucosa, no erythema. Throat, no erythema. No exudate seen. Lips and gums, no lesions.   NECK: No JVD. No bruits. No  lymphadenopathy. No thyromegaly. No thyroid nodules palpated.   RESPIRATORY: Poor air entry bilaterally. Positive rhonchi at the base.   CARDIOVASCULAR: S1 and S2 tachycardic. No gallops, rubs, or murmurs heard. Carotid upstroke 2+ bilaterally. No bruits.   EXTREMITIES: Dorsalis pedis pulses 2+ bilaterally. No edema of the lower extremity.   ABDOMEN: Soft, nontender. No organomegaly/splenomegaly. Normoactive bowel sounds. No masses felt.   LYMPHATIC: No lymph nodes in the neck.   MUSCULOSKELETAL: No clubbing, edema, or cyanosis. Palpating on right arm, no hematoma or masses felt.   SKIN: No ulcers or lesions seen.   NEUROLOGIC: Cranial nerves II through XII grossly intact. Deep tendon reflexes 2+ bilaterally.   PSYCHIATRIC: The patient is oriented to person, place, and time.   LABORATORY, DIAGNOSTIC, AND RADIOLOGICAL DATA: Urinalysis: 1+ leukocyte esterase. ABG showed pH of 7.30, pCO2 57, pO2 456, that is 100% on BiPAP, bicarbonate 28. White blood cell count 18.6, hemoglobin and hematocrit 13.0 and 40.3, platelet count 375. Glucose 145, BUN 13, creatinine 0.81, sodium 147, potassium 3.8, chloride 108, CO2 28, calcium 8.7. Liver function tests normal. Troponin negative. INR 0.9. EKG shows sinus tachycardia at 145 beats per minute, nonspecific ST-T wave changes.   ASSESSMENT AND PLAN:  1. Acute on chronic respiratory failure with chronic obstructive pulmonary disease exacerbation. BiPAP in order to oxygenate. Will treat with IV Solu-Medrol 125 mg IV x1 and 60 mg IV q.6h. Since was recently sent home on Levaquin, we will switch antibiotics to Rocephin and Zithromax. Continue the patient's Advair and Spiriva. The patient is a DO NOT RESUSCITATE. Overall prognosis is poor. I will get a palliative care consultation. May be a candidate for hospice at home. I will get a CT scan of the chest to rule out pulmonary embolism. Looking back at prior CT scan of the chest, it showed pulmonary fibrosis and  severe emphysematous changes. Most likely that will be the result of this CT scan of the chest.  2. Systemic inflammatory response syndrome, most likely from the respiratory failure. Will continue metoprolol. Get a CT scan of the chest to rule out pulmonary embolism. Empiric antibiotics with Rocephin and Zithromax. Blood culture sent off by ER physician.  3. Hyperlipidemia. Continue pravastatin.  4. Depression. Continue Effexor and Remeron.  5. Gastroesophageal reflux disease. Continue omeprazole.  6. Diarrhea. We will send off a stool for C. difficile.  7. Positive urinalysis. We will send off a urine culture.   TIME SPENT ON ADMISSION: 55 minutes critical care time.   Since the patient is a DO NOT RESUSCITATE, we will try to keep out of the Intensive Care Unit.   ____________________________ Herschell Dimesichard J. Renae GlossWieting, MD rjw:ap D: 04/12/2012 03:30:34 ET T: 04/12/2012 07:13:05 ET JOB#: [id number]  cc: Herschell Dimesichard J. Renae GlossWieting, MD, <Dictator> Maura L. Hamrick, MD Salley ScarletICHARD J Arnell Mausolf MD ELECTRONICALLY SIGNED 04/17/2012 14:12

## 2014-10-02 NOTE — Discharge Summary (Signed)
PATIENT NAME:  Danielle Moran, Danielle Moran MR#:  [id number] DATE OF BIRTH:  [birth year]  DATE OF ADMISSION:  04/12/2012 DATE OF DISCHARGE:  04/14/2012  For a detailed note, please take a look at the history and physical done on admission by Dr. Alford Highland.   DIAGNOSES AT DISCHARGE:  1. Acute on chronic respiratory failure secondary to chronic obstructive pulmonary disease exacerbation.  2. Chronic obstructive pulmonary disease exacerbation.  3. Hypertension.  4. Hyperlipidemia. 5. Gastroesophageal reflux disease.   6. Neuropathy.   DIET: The patient is being discharged on a low sodium, low fat diet.   ACTIVITY: As tolerated.   FOLLOW-UP: Follow-up with Dr. Burnell Blanks in the next 1 to 2 weeks.    DISPOSITION: The patient is being discharged home with Hospice services.   CODE STATUS: The patient is a DO NOT INTUBATE, DO NOT RESUSCITATE.   DISCHARGE MEDICATIONS:  1. Celexa 40 mg daily.  2. Calcium carbonate 600 mg daily.  3. Albuterol inhaler 2 puffs q.i.d. as needed.  4. Effexor 75 mg b.i.d.  5. Pravachol 20 mg at bedtime.  6. Tylenol as needed.  7. Metoprolol succinate 12.5 mg daily.  8. Temazepam 15 mg at bedtime.  9. Vitamin D2 50,000 international units weekly.  10. Omeprazole 20 mg b.i.d.  11. Gabapentin 400 mg t.i.d.  12. Lasix 20 mg daily.  13. Prednisone taper starting at 50 mg x2 days, down to 10 mg over the next 10 days. 14. Levaquin 500 mg daily x5 days.   PERTINENT STUDIES DONE DURING THE HOSPITAL COURSE: CT scan of the chest done with contrast showing no evidence of any pulmonary embolism but moderate centrilobular emphysema.   Chest x-ray done on admission showed finding representing sequelae of pulmonary fibrosis.   HOSPITAL COURSE: This is a 81 year old female with medical problems as mentioned above who presented to the hospital secondary to shortness of breath and acute respiratory failure.  1. Acute on chronic respiratory failure. This was likely secondary to COPD  exacerbation. The patient has underlying severe COPD and is already oxygen dependent. Initially when she presented to the hospital she was placed on BiPAP, also started on high dose IV steroids, around-the-clock nebulizer treatments, and also empiric antibiotics. The patient's clinical symptoms have significantly improved since admission. She was weaned off of BiPAP after 24 hours and currently remains on her baseline O2. Her steroids were then tapered and she is currently being discharged on a prednisone taper along with her maintenance inhalers as stated. Since she has severe COPD, a Palliative Care consult was also obtained and she was arranged for Hospice services upon discharge.  2. Chronic obstructive pulmonary disease exacerbation, likely the cause of the patient's respiratory failure. Again, the patient was treated aggressively with IV steroids, around-the-clock nebulizer treatments, and continued on albuterol inhaler and empiric antibiotics. The patient's clinical symptoms have improved. As mentioned, she has been weaned off BiPAP back down to her baseline oxygen. She currently is being discharged on her albuterol along with prednisone taper and empiric Levaquin as stated.  3. Hypertension. The patient was maintained on her Toprol. She will resume that upon discharge. 4. Hyperlipidemia. The patient was maintained on Pravachol. She will resume that upon discharge. 5. Gastroesophageal reflux disease. The patient was maintained on omeprazole. She will resume that upon discharge too.  6. Depression. The patient was maintained on her Effexor and Celexa and she will resume that upon discharge.   CODE STATUS: The patient is a DO NOT INTUBATE, DO NOT RESUSCITATE.  TIME SPENT: 40 minutes.   ____________________________ Rolly PancakeVivek J. Cherlynn KaiserSainani, MD vjs:drc D: 04/14/2012 16:14:09 ET T: 04/15/2012 08:26:04 ET JOB#: [id number]  cc: Rolly PancakeVivek J. Cherlynn KaiserSainani, MD, <Dictator> Maura L. Hamrick, MD [place] SirenVIVEK J SAINANI  MD ELECTRONICALLY SIGNED 04/21/2012 12:56

## 2014-10-02 NOTE — Discharge Summary (Signed)
PATIENT NAME:  Danielle Moran, Ellarose MR#:  [id number] DATE OF BIRTH:  July 05, 1930  DATE OF ADMISSION:  02/04/2012 DATE OF DISCHARGE:  02/07/2012  DISCHARGE DIAGNOSIS: Chronic obstructive pulmonary disease exacerbation secondary to pneumonia.  ADMISSION DIAGNOSES:  1. Altered mental status.  2. Pneumonia.  DISCHARGE CONDITION: Stable.   PRESENTATION: This is a 81 year old female who was brought into the Emergency Room with altered mental status and confusion. The patient herself could not recall any events. The history was obtained from the family at that time. As per the family, she was not in her usual state of health when she woke up in the morning and later on the patient's family found the patient unresponsive sitting in her recliner. She was not arousable to sternal rub or loud voice. EMS was called and they brought her to the hospital. The patient was hemodynamically stable but when arrived to the ER triage she was also unresponsive. Mental status slowly improved after receiving IV fluids in the ER. For the last few days the patient had p.o. intake decreased and she also had a fall the previous day coming to the ER and had a bruise on the left side of the eye but CT scan in the ER did not show any acute abnormality in the head. She was admitted. There was a questionable pneumonia on chest x-ray so she was started on treatment for pneumonia.   HOSPITAL STAY: During her stay after receiving IV fluids and treatment for pneumonia with IV antibiotics, she saw signs of improvement and she went to her baseline status within two days. She did not complain of any shortness of breath or any cough anymore. The blood culture which was done on admission came back negative and so she was planned to be discharged home. During the hospital stay there were other complaints like altered mental status and confusion which improved after treatment for pneumonia. She was also having sinus tachycardia but after receiving fluids  her heart rate slowly came down. She had history of history of chronic obstructive pulmonary disease and she was an ex-smoker so she was continued during the hospital stay on Advair and Spiriva and as needed nebulizer treatment. She was continued on oxygen supplementation as she was using at home 2 to 3 liters oxygen by nasal cannula. Her hypertension remained under control with metoprolol use. Hyperlipidemia remained under control with Pravachol. She had gastroesophageal reflux disease which was managed with use of Prilosec. For depression, she was continued on Celexa. A Psych consult was done on this patient and they suggested to increase Celexa because of slight signs and symptoms of depression. After increasing the dose, she was discharged home with increased dose.   DISCHARGE MEDICATIONS: 1. Omeprazole 20 mg delayed-release capsule one capsule two times a day. 2. Pravastatin 20 mg oral tablet once a day. 3. Tylenol Arthritis 650 mg tablet extended-release as needed.  4. ProAir HFA 90 mcg per inhalation aerosol 2 puffs as needed for shortness of breath. 5. Advair Diskus one puff inhaled 2 times a day. 6. Calcium carbonate 1 tablet orally once a day. 7. Citalopram 40 mg tablet once a day. 8. Metoprolol 25 mg extended-release tablet once a day. 9. Zithromax 500 mg tablet for four more days to finish antibiotic course.  10. We also continued her Spiriva 18 mcg.  STOP THE FOLLOWING MEDICATIONS:  1. Gabapentin 400 mg oral capsule. 2. Temazepam because she came in with altered mental status and we did not want her to take benzodiazepine.  DISCHARGE CONDITION: Good.   CODE STATUS: FULL CODE.  ALLERGIES: No known drug allergies.  DIET: Low sodium diet. Diet consistency regular.   OXYGEN AT HOME: 2 liters nasal cannula.   REFERRAL: Home health services as suggested by physical therapy, occupational therapy, and nurse aide. She will also be referred for home health aide.   ACTIVITY  LIMITATIONS: As tolerated and per physical therapy.   FOLLOW-UP: She was advised to follow-up with her primary care physician, Dr. Nathanial Rancher, within 1 to 2 weeks.   ____________________________ Hope Pigeon Elisabeth Pigeon, MD vgv:drc D: 02/11/2012 21:07:39 ET T: 02/12/2012 06:33:12 ET JOB#: [id number]  cc: Hope Pigeon. Elisabeth Pigeon, MD, <Dictator> Maura L. Hamrick, MD Altamese Dilling MD ELECTRONICALLY SIGNED 03/02/2012 14:29

## 2014-10-02 NOTE — Consult Note (Signed)
PATIENT NAME:  Danielle Moran, Danielle MR#:  [id number] DATE OF BIRTH:  Jul 30, 1930  DATE OF CONSULTATION:  02/06/2012  REFERRING PHYSICIAN:  Felipa Furnaceoberto Sanchez Gutierrez, MD  CONSULTING PHYSICIAN:  Danielle AlbinoAarti K. Maryruth BunKapur, MD  REASON FOR CONSULTATION: Depressive symptoms.   HISTORY OF PRESENT ILLNESS: Danielle Moran is a 81 year old widowed Caucasian female with a history of multiple medical problems including COPD, hypertension, and hyperlipidemia who was admitted to the Medicine service with altered mental status, confusion, and possible pneumonia. Psychiatry was consulted due to worsening depressive symptoms. The patient herself admitted that she had been more depressed recently in the past 3 to 4 months. She is having a difficult time living with her daughter and son-in-law. The patient said she does not like being dependent on them for everything and says that her daughter has a "bad mouth" and speaks her mind to her. The patient does not like it when her daughter argues with her like that but she tries not to talk back to her. She does not want to make the situation worse between her daughter and son-in-law. She says that she is not able to leave the house or go out unless her daughter and son-in-law want to. Her brother has cancer and she is not able to visit him very often. She denies any feelings of hopelessness, crying spells, or difficulty with focus and concentration but has had difficulty with insomnia. She is now on temazepam 15 mg p.o. nightly in the hospital, although admission list on the H and P lists Remeron 15 mg p.o. nightly. The patient herself is unable to remember her own medications. She denies any auditory or visual hallucinations, paranoid thoughts, or delusions. She denies any history of any prior symptoms consistent with bipolar mania including grandiose delusions, hyperreligious thoughts, or hypersexual behavior. She denies any current suicidal thoughts or history of suicide attempts in the past. On  admission the patient was on Celexa 20 mg p.o. daily. Dr. Berlinda LastSanchez Moran has increased the dose of Celexa to 40 mg p.o. daily.   PAST PSYCHIATRIC HISTORY: The patient denies any prior inpatient psychiatric hospitalizations or suicide attempts. She cannot remember any past psychotropic medication trials or how long she has actually been on the Celexa.   SUBSTANCE ABUSE HISTORY: She denies any history of any heavy alcohol use, cocaine, cannabis, opiate, or stimulant use. She does smoke 1-1/2 packs of cigarettes per day and has been smoking for over four years. She denies any history of any mental illness or substance use in the family.   PAST MEDICAL HISTORY:  1. Chronic obstructive pulmonary disease. 2. Hypertension. 3. Hyperlipidemia. 4. Depression. 5. Gastroesophageal reflux disease.  6. She denies any history of any prior TBI or seizures.   CURRENT INPATIENT MEDICATIONS:  1. Zithromax q.24 hours 500 mg. 2. Rocephin 1 gram IV q.24 hours. 3. Celexa 40 mg p.o. daily. 4. Neurontin 400 mg p.o. 3 times a day. 5. Metoprolol ER 12.5 mg p.o. daily. 6. Prilosec 20 mg p.o. b.i.d. PPI.  7. Pravastatin 20 mg p.o. at bedtime. 8. Heparin sub-Q 5000 units q.12 hours. 9. Advair Diskus 250/50 one puff b.i.d.   ALLERGIES: No known drug allergies.   MENTAL STATUS EXAM: Ms. Danielle Moran is a 81 year old pleasant Caucasian female who is sitting up in her recliner next to her hospital bed. She was wearing a hospital gown and oxygen was in place. She was calmly watching TV and reading the newspaper. The patient was fully alert and oriented to time, place, and situation.  She knew the exact date as her birth date is tomorrow and will be her 81th birthday. Speech was regular rate and rhythm, fluent and coherent, non-pressured. Mood was depressed and affect was restricted. Thought processes were linear, logical, and goal directed. She denied any current suicidal or homicidal thoughts. She denied any current auditory or  visual hallucinations. She denied any paranoid thoughts or delusions. Attention and concentration are fairly good. Recall was 3 out of 3 initially and 3 out of 3 after five minutes. She could name the presidents backwards to [place]ception. Abstraction was good.   SUICIDE RISK ASSESSMENT: At this time the patient denies any suicidal thoughts and is at a fairly low risk of harm to self and others. She denies any access to guns.   REVIEW OF SYSTEMS: CONSTITUTIONAL: She does complain of some weakness but no fatigue or weight changes. She denies any fever, chills, or night sweats. HEAD: She denies any headaches or dizziness. EYES: She denies any diplopia or blurred vision. ENT: She denies any hearing loss, neck pain, or throat pain. RESPIRATORY: She does complain of some shortness of breath but no cough. CARDIOVASCULAR: She denies any chest pain or orthopnea. GI: She denies any nausea, vomiting, or abdominal pain. She denies any change in bowel movements. GU: She denies incontinence or problems with frequency of urine. ENDOCRINE: She denies any heat or cold intolerance. LYMPHATIC: She denies any anemia or easy bruising. MUSCULOSKELETAL: She denies any muscle or joint pain. NEUROLOGIC: She denies any tingling or weakness but does use a walker to ambulate. PSYCHIATRIC: Please see history of present illness.   PHYSICAL EXAMINATION:   VITAL SIGNS: Blood pressure 127/75, heart rate 94, respirations 16, temperature 98.7. Please see initial physical exam as completed by Dr. Hilda Moran.   LABORATORY, DIAGNOSTIC, AND RADIOLOGICAL DATA: BMP within normal limits. White blood cell count 9.4, hemoglobin 10.7, platelet count 416. Blood cultures x2 show no growth to date. Urinalysis was nitrite negative, leukocyte esterase negative, less than 1 WBC, no bacteria.   EKG showed a ventricular rate of 112 with a QTc interval of 458.  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   AXIS II: Deferred.    AXIS III:  1. Chronic obstructive pulmonary disease. 2. Hypertension.  3. Gastroesophageal reflux disease.  4. Hyperlipidemia.   AXIS IV: Moderate. Family conflict, inability to care for self independently.   AXIS V: GAF at present equals 55 to 60.   ASSESSMENT AND TREATMENT RECOMMENDATIONS: Ms. Blaize is a 81 year old widowed Caucasian female with a history of depression recently worsened in the past 3 to 4 months. The patient has been having increasing conflict with her daughter but has a difficult time verbalizing her feelings towards her daughter. She says her daughter "speaks her mind" and the patient just listens and takes it. She denies any neglect or abuse from her daughter but says that she is not able to go out of the house as much as she would like to and is dependent on her daughter and son-in-law. At this time she denies any current suicidal thoughts or intent to harm herself or others and is not an imminent danger to herself or others necessitating inpatient psychiatric hospitalization. Celexa was just increased from 20 mg p.o. daily to 40 mg p.o. daily by Dr. Berlinda Last on August 23rd to help with depression. In addition, she has been getting temazepam 15 mg at bedtime p.r.n. for insomnia. Will continue both of these medications for now and if  depressive symptoms do not improve within the next few weeks would consider adding Abilify 1 to 2 mg p.o. daily as an adjunct for depression. The patient would benefit from some supportive psychotherapy when she leaves here and will recommend a follow-up appointment with [facility]. Risks, benefits, and alternatives to treatment were discussed with the patient and she is agreeable to the treatment plan.   ____________________________ Danielle Moran. Maryruth Bun, MD akk:drc D: 02/06/2012 11:58:21 ET T: 02/06/2012 12:10:28 ET JOB#: [id number]  cc: Danielle K. Maryruth Bun, MD, <Dictator> Darliss Ridgel MD ELECTRONICALLY SIGNED 02/09/2012 10:02

## 2014-10-02 NOTE — H&P (Signed)
PATIENT NAME:  Danielle Moran, Danielle Moran MR#:  [id number] DATE OF BIRTH:  03/09/1931  DATE OF ADMISSION:  02/04/2012  PRIMARY CARE PHYSICIAN: Dr. Burnell BlanksMaura Hamrick   CHIEF COMPLAINT: Confusion, altered mental status.   HISTORY OF PRESENT ILLNESS: This is a 81 year old female who was brought in to the Emergency Room due to altered mental status and confusion. Patient herself cannot recall the events, therefore, most of the history obtained from the family at bedside. As per the family patient was in her usual state of health when she woke up this morning and ate breakfast. She was sitting on the recliner and relaxing and when the family member asked her to move to a different room patient there was not interested in moving at that point. Half hour later patient was apparently unresponsive and was not arousable even to sternal rub or to a loud voice. EMS was called and she was brought to the hospital. Patient was hemodynamically stable. Also when patient arrived to triage here in the ER she was also not responsive to sternal rub. Her mental status presently now is back down to baseline after receiving some IV fluids. As per the family, patient has not had any poor p.o. intake. She did have a fall about week or so ago and she has a bruise on the left side of her eye. CT head did not show any evidence of any acute abnormality. Patient is being admitted for further observation in the hospital presently. She denies any cough. She denies any fever. She denies any nausea, vomiting, abdominal pain. She does admit to a cough and shortness of breath, probably chronic from her chronic obstructive pulmonary disease. Otherwise no other associated symptoms presently.   REVIEW OF SYSTEMS: CONSTITUTIONAL: No documented fever. No weight gain.  No weight loss. EYES: No blurred or double vision. ENT: No tinnitus. No postnasal drip. No redness of the oropharynx. RESPIRATORY: Positive cough, chronic. Positive chronic obstructive pulmonary disease.  No wheeze, no hemoptysis. Positive dyspnea. CARDIOVASCULAR: No chest pain, no orthopnea, no palpitations, no syncope. GASTROINTESTINAL: No nausea, no vomiting, no diarrhea, no abdominal pain, no melena, no hematochezia. GENITOURINARY: No dysuria, no hematuria. ENDOCRINE: No polyuria or nocturia. No heat or cold intolerance. HEME: No anemia, no bruising, no bleeding. INTEGUMENTARY: No rashes. No lesions. MUSCULOSKELETAL: No arthritis, no swelling, no gout. NEUROLOGIC: No numbness, no tingling, no ataxia, no seizure-type activity. PSYCH: No anxiety, no insomnia, no ADD.  PAST MEDICAL HISTORY:  1. Chronic obstructive pulmonary disease.  2. Hypertension.  3. Hyperlipidemia.  4. Depression.  5. Gastroesophageal reflux disease.   ALLERGIES: No known drug allergies.   SOCIAL HISTORY: Used to be a long-time smoker, quit about a few years ago. Occasional alcohol use. No illicit drug abuse. Lives at home with her son and her daughter-in-law.   FAMILY HISTORY: No significant family history of coronary artery disease or diabetes.   CURRENT MEDICATIONS:  1. Advair 250/50, 1 puff b.i.d.  2. Celexa 20 mg daily.  3. Lasix 20 mg daily.  4. Calcium carbonate 1 tab daily.  5. Gabapentin 400 mg t.i.d.  6. Metoprolol succinate 12.5 mg daily.  7. Omeprazole 20 mg b.i.d.  8. Percocet 5/325, 1 tab q.6 hours as needed.  9. Pravachol 20 mg at bedtime.  10. Albuterol inhaler 2 puffs q.6 hours as needed.  11. Spiriva 1 puff daily.  12. Remeron 15 mg at bedtime.  13. Tylenol arthritis 650 as needed.   PHYSICAL EXAMINATION:  VITAL SIGNS: Temperature 98.4, pulse 119, respirations  18, blood pressure 110/65, sats 95% on 2 liters nasal.   GENERAL: She is a pleasant appearing female lethargic but in no apparent distress.   HEENT: Atraumatic, normocephalic. Extraocular muscles are intact. Pupils equal, reactive to light. Sclerae anicteric. No conjunctival injection. No pharyngeal erythema.   NECK: Supple. There is  no jugular venous distention, no bruits, no lymphadenopathy, no thyromegaly.   HEART: Tachycardic, regular. No murmurs, no rubs, no clicks.   LUNGS: She has some prolonged inspiratory and expiratory phase. A few bibasilar crackles, otherwise negative use of accessory muscles. No dullness to percussion.   ABDOMEN: Soft, flat, nontender, nondistended. Has good bowel sounds. No hepatosplenomegaly appreciated.   EXTREMITIES: No evidence of any cyanosis, clubbing, or peripheral edema. Has +2 pedal and radial pulses bilaterally.   NEUROLOGIC: Patient is alert, awake, oriented x3. No other focal motor or sensory deficits appreciated bilaterally.   SKIN: Moist, warm with no rash appreciated.   LYMPHATIC: There is no cervical or axillary lymphadenopathy.   LABORATORY, DIAGNOSTIC, AND RADIOLOGICAL DATA: Serum glucose 81, BUN 12, creatinine 0.7, sodium 140, potassium 4.4, chloride 102, bicarbonate 32. LFTs were normal limits. Troponin less than 0.02. White cell count 11, hemoglobin 12.9, hematocrit 41.0, platelet count 47. Urinalysis is within normal limits.   Patient did have a lactic acid which is normal. Patient also underwent a CT of the head which showed no evidence of any acute intracranial process. Patient also underwent a chest x-ray the results of which are still pending.   ASSESSMENT AND PLAN: This is a 81 year old female with a history of chronic obstructive pulmonary disease, oxygen dependent, hypertension, hyperlipidemia, depression, gastroesophageal reflux disease presents to the hospital due to altered mental status and confusion.  1. Altered mental status/confusion. The exact etiology of this is still currently unclear but is suspected due to possible underlying infectious process, likely pneumonia. Patient did present with some tachycardia and an increased respiratory rate therefore she is being empirically treated for community-acquired pneumonia and has been started on fluids and her  mental status has improved here in the Emergency Room. Patient did have a fall a few days ago but I do not appreciate any evidence of neurologic abnormality. Her CT is negative and she has a nonfocal neurological exam and she is currently alert and oriented. Will go ahead and treat her for the community-acquired pneumonia and follow her mental status closely.  2. Evidence of pneumonia given the fact that she is short of breath, has a cough and tachycardia. Will go ahead and treat her with IV ceftriaxone and Zithromax and follow her cultures.  3. Chronic obstructive pulmonary disease. No evidence of any acute exacerbation. I will continue her on Advair, Spiriva and p.r.n. nebulizer treatments. Continue with oxygen supplementation. She is usually on 3 liters oxygen at home.  4. Hypertension. Continue metoprolol.  5. Hyperlipidemia. Continue Pravachol. 6. Gastroesophageal reflux disease. Continue Prilosec.  7. Depression. Continue with Celexa and Remeron.  8. CODE STATUS: Patient is a FULL CODE.   TIME SPENT: 45 minutes.  ____________________________ Rolly Pancake. Cherlynn Kaiser, MD vjs:cms D: 02/04/2012 19:16:27 ET T: 02/05/2012 05:44:51 ET JOB#: [id number]  cc: Rolly Pancake. Cherlynn Kaiser, MD, <Dictator> Maura L. Hamrick, MD [place] Siren MD ELECTRONICALLY SIGNED 02/10/2012 22:33
